# Patient Record
Sex: Female | Born: 1950 | Race: White | Hispanic: No | Smoking: Never smoker
Health system: Southern US, Community
[De-identification: ages and names within clinical notes are randomized; demographics above are authoritative.]

## PROBLEM LIST (undated history)

## (undated) DIAGNOSIS — R51 Headache: Secondary | ICD-10-CM

## (undated) DIAGNOSIS — F329 Major depressive disorder, single episode, unspecified: Secondary | ICD-10-CM

## (undated) DIAGNOSIS — R519 Headache, unspecified: Secondary | ICD-10-CM

## (undated) DIAGNOSIS — F32A Depression, unspecified: Secondary | ICD-10-CM

## (undated) HISTORY — DX: Major depressive disorder, single episode, unspecified: F32.9

## (undated) HISTORY — DX: Headache, unspecified: R51.9

## (undated) HISTORY — DX: Headache: R51

## (undated) HISTORY — DX: Depression, unspecified: F32.A

---

## 1996-08-01 HISTORY — PX: COSMETIC SURGERY: SHX468

## 1996-08-01 HISTORY — PX: TUBAL LIGATION: SHX77

## 1998-12-23 ENCOUNTER — Other Ambulatory Visit: Admission: RE | Admit: 1998-12-23 | Discharge: 1998-12-23 | Payer: Self-pay | Admitting: Obstetrics and Gynecology

## 1999-09-14 ENCOUNTER — Encounter: Payer: Self-pay | Admitting: Obstetrics and Gynecology

## 1999-09-14 ENCOUNTER — Encounter: Admission: RE | Admit: 1999-09-14 | Discharge: 1999-09-14 | Payer: Self-pay | Admitting: Obstetrics and Gynecology

## 1999-12-22 ENCOUNTER — Other Ambulatory Visit: Admission: RE | Admit: 1999-12-22 | Discharge: 1999-12-22 | Payer: Self-pay | Admitting: Obstetrics and Gynecology

## 2000-09-19 ENCOUNTER — Encounter: Payer: Self-pay | Admitting: Obstetrics and Gynecology

## 2000-09-19 ENCOUNTER — Encounter: Admission: RE | Admit: 2000-09-19 | Discharge: 2000-09-19 | Payer: Self-pay | Admitting: Obstetrics and Gynecology

## 2001-09-27 ENCOUNTER — Encounter: Payer: Self-pay | Admitting: Obstetrics and Gynecology

## 2001-09-27 ENCOUNTER — Encounter: Admission: RE | Admit: 2001-09-27 | Discharge: 2001-09-27 | Payer: Self-pay | Admitting: Obstetrics and Gynecology

## 2002-07-17 ENCOUNTER — Encounter: Admission: RE | Admit: 2002-07-17 | Discharge: 2002-07-17 | Payer: Self-pay | Admitting: Obstetrics and Gynecology

## 2002-07-17 ENCOUNTER — Encounter: Payer: Self-pay | Admitting: Obstetrics and Gynecology

## 2002-08-05 ENCOUNTER — Other Ambulatory Visit: Admission: RE | Admit: 2002-08-05 | Discharge: 2002-08-05 | Payer: Self-pay | Admitting: Obstetrics and Gynecology

## 2002-08-23 ENCOUNTER — Encounter: Payer: Self-pay | Admitting: Obstetrics and Gynecology

## 2002-08-23 ENCOUNTER — Encounter: Admission: RE | Admit: 2002-08-23 | Discharge: 2002-08-23 | Payer: Self-pay | Admitting: Obstetrics and Gynecology

## 2002-10-15 ENCOUNTER — Encounter: Payer: Self-pay | Admitting: Obstetrics and Gynecology

## 2002-10-15 ENCOUNTER — Encounter: Admission: RE | Admit: 2002-10-15 | Discharge: 2002-10-15 | Payer: Self-pay | Admitting: Obstetrics and Gynecology

## 2003-11-06 ENCOUNTER — Encounter: Admission: RE | Admit: 2003-11-06 | Discharge: 2003-11-06 | Payer: Self-pay | Admitting: Obstetrics and Gynecology

## 2004-07-21 ENCOUNTER — Encounter: Admission: RE | Admit: 2004-07-21 | Discharge: 2004-07-21 | Payer: Self-pay | Admitting: Obstetrics and Gynecology

## 2004-12-31 ENCOUNTER — Encounter: Admission: RE | Admit: 2004-12-31 | Discharge: 2004-12-31 | Payer: Self-pay | Admitting: Obstetrics and Gynecology

## 2006-01-05 ENCOUNTER — Encounter: Admission: RE | Admit: 2006-01-05 | Discharge: 2006-01-05 | Payer: Self-pay | Admitting: Obstetrics and Gynecology

## 2006-10-18 ENCOUNTER — Other Ambulatory Visit: Admission: RE | Admit: 2006-10-18 | Discharge: 2006-10-18 | Payer: Self-pay | Admitting: Obstetrics and Gynecology

## 2007-01-17 ENCOUNTER — Encounter: Admission: RE | Admit: 2007-01-17 | Discharge: 2007-01-17 | Payer: Self-pay | Admitting: Obstetrics and Gynecology

## 2008-02-06 ENCOUNTER — Other Ambulatory Visit: Admission: RE | Admit: 2008-02-06 | Discharge: 2008-02-06 | Payer: Self-pay | Admitting: Obstetrics and Gynecology

## 2008-02-15 ENCOUNTER — Encounter: Admission: RE | Admit: 2008-02-15 | Discharge: 2008-02-15 | Payer: Self-pay | Admitting: Obstetrics and Gynecology

## 2008-06-19 ENCOUNTER — Emergency Department (HOSPITAL_COMMUNITY): Admission: EM | Admit: 2008-06-19 | Discharge: 2008-06-19 | Payer: Self-pay | Admitting: Emergency Medicine

## 2009-02-25 ENCOUNTER — Encounter: Admission: RE | Admit: 2009-02-25 | Discharge: 2009-02-25 | Payer: Self-pay | Admitting: Obstetrics and Gynecology

## 2010-03-08 ENCOUNTER — Encounter: Admission: RE | Admit: 2010-03-08 | Discharge: 2010-03-08 | Payer: Self-pay | Admitting: Obstetrics and Gynecology

## 2010-04-21 ENCOUNTER — Encounter: Admission: RE | Admit: 2010-04-21 | Discharge: 2010-04-21 | Payer: Self-pay | Admitting: Internal Medicine

## 2010-09-17 ENCOUNTER — Other Ambulatory Visit: Payer: Self-pay | Admitting: Neurology

## 2010-09-17 ENCOUNTER — Ambulatory Visit
Admission: RE | Admit: 2010-09-17 | Discharge: 2010-09-17 | Disposition: A | Discharge status: Home / Self Care | Payer: BC Managed Care – PPO | Source: Ambulatory Visit | Attending: Neurology | Admitting: Neurology

## 2010-09-17 DIAGNOSIS — R51 Headache: Secondary | ICD-10-CM

## 2011-01-12 ENCOUNTER — Other Ambulatory Visit: Payer: Self-pay | Admitting: Neurology

## 2011-01-12 DIAGNOSIS — R51 Headache: Secondary | ICD-10-CM

## 2011-01-17 ENCOUNTER — Other Ambulatory Visit: Payer: Self-pay | Admitting: Radiology

## 2011-01-17 ENCOUNTER — Other Ambulatory Visit (HOSPITAL_COMMUNITY)
Admission: RE | Admit: 2011-01-17 | Discharge: 2011-01-17 | Disposition: A | Discharge status: Home / Self Care | Payer: BC Managed Care – PPO | Source: Ambulatory Visit | Attending: Radiology | Admitting: Radiology

## 2011-01-17 ENCOUNTER — Ambulatory Visit
Admission: RE | Admit: 2011-01-17 | Discharge: 2011-01-17 | Disposition: A | Discharge status: Home / Self Care | Payer: BC Managed Care – PPO | Source: Ambulatory Visit | Attending: Neurology | Admitting: Neurology

## 2011-01-17 DIAGNOSIS — R51 Headache: Secondary | ICD-10-CM

## 2011-02-04 ENCOUNTER — Other Ambulatory Visit: Payer: Self-pay | Admitting: Internal Medicine

## 2011-02-04 DIAGNOSIS — Z1231 Encounter for screening mammogram for malignant neoplasm of breast: Secondary | ICD-10-CM

## 2011-03-14 ENCOUNTER — Ambulatory Visit
Admission: RE | Admit: 2011-03-14 | Discharge: 2011-03-14 | Disposition: A | Discharge status: Home / Self Care | Payer: BC Managed Care – PPO | Source: Ambulatory Visit | Attending: Internal Medicine | Admitting: Internal Medicine

## 2011-03-14 DIAGNOSIS — Z1231 Encounter for screening mammogram for malignant neoplasm of breast: Secondary | ICD-10-CM

## 2011-10-31 ENCOUNTER — Other Ambulatory Visit: Payer: Self-pay | Admitting: Internal Medicine

## 2012-01-22 ENCOUNTER — Other Ambulatory Visit: Payer: Self-pay | Admitting: Internal Medicine

## 2012-01-30 ENCOUNTER — Other Ambulatory Visit: Payer: Self-pay | Admitting: Physician Assistant

## 2012-04-17 ENCOUNTER — Other Ambulatory Visit: Payer: Self-pay | Admitting: Internal Medicine

## 2012-04-17 DIAGNOSIS — Z1231 Encounter for screening mammogram for malignant neoplasm of breast: Secondary | ICD-10-CM

## 2012-04-18 ENCOUNTER — Encounter: Payer: Self-pay | Admitting: Internal Medicine

## 2012-04-18 ENCOUNTER — Ambulatory Visit (INDEPENDENT_AMBULATORY_CARE_PROVIDER_SITE_OTHER): Payer: BC Managed Care – PPO | Admitting: Internal Medicine

## 2012-04-18 VITALS — BP 120/82 | HR 73 | Temp 97.7°F | Resp 16 | Ht 64.0 in | Wt 170.0 lb

## 2012-04-18 DIAGNOSIS — M531 Cervicobrachial syndrome: Secondary | ICD-10-CM

## 2012-04-18 DIAGNOSIS — F32A Depression, unspecified: Secondary | ICD-10-CM

## 2012-04-18 DIAGNOSIS — Z6829 Body mass index (BMI) 29.0-29.9, adult: Secondary | ICD-10-CM

## 2012-04-18 DIAGNOSIS — R2230 Localized swelling, mass and lump, unspecified upper limb: Secondary | ICD-10-CM

## 2012-04-18 DIAGNOSIS — F329 Major depressive disorder, single episode, unspecified: Secondary | ICD-10-CM | POA: Insufficient documentation

## 2012-04-18 DIAGNOSIS — G4700 Insomnia, unspecified: Secondary | ICD-10-CM

## 2012-04-18 DIAGNOSIS — E785 Hyperlipidemia, unspecified: Secondary | ICD-10-CM

## 2012-04-18 DIAGNOSIS — E039 Hypothyroidism, unspecified: Secondary | ICD-10-CM

## 2012-04-18 DIAGNOSIS — H8109 Meniere's disease, unspecified ear: Secondary | ICD-10-CM

## 2012-04-18 DIAGNOSIS — R229 Localized swelling, mass and lump, unspecified: Secondary | ICD-10-CM

## 2012-04-18 DIAGNOSIS — M5481 Occipital neuralgia: Secondary | ICD-10-CM

## 2012-04-18 MED ORDER — AMOXICILLIN-POT CLAVULANATE 875-125 MG PO TABS: 1.0000 | ORAL_TABLET | Freq: Two times a day (BID) | ORAL | Status: DC

## 2012-04-18 MED ORDER — TEMAZEPAM 30 MG PO CAPS: 30.0000 mg | ORAL_CAPSULE | Freq: Every evening | ORAL | Status: DC | PRN

## 2012-04-18 NOTE — Progress Notes (Addendum)
  Subjective:    Patient ID: Danielle Jacobs, female    DOB: [birth year], 61 y.o.   MRN: [id number]  HPIComplaining of a firm lump in the right triceps area for the past 2 months/occasionally tender  Continues to complain of severe lancinating pain in the right occiput/has been evaluated by Dr. Sandria Manly and by Dr. Neale Burly and no therapies have been successful over the last 3 years. This leaves her greatly distressed every day. She still cannot identify precipitating factors.  Many years has been stable since Dr. Dorma Russell put a tube in her left ear  Hypothyroidism stable Hyperlipidemia no medication side effects Depression-still greatly affected by her daughter's trials with alcohol   Review of Systems  additional symptoms=Recent outdoor venture led to diarrhea with fever followed by resp. illness. She does continue to cough for the last 3 weeks although there is no fever or night sweats and cough is not productive  Also continues to complain of intermittent insomnia/frequently using over-the-counter sleep aids Long history of this/has responded her Restoril in the past    Objective:   Physical Exam Vital signs stable except overweight HEENT clear Lungs clear  heart regular without murmur Right arm-1.5 cm firm mass- slightly tender, freely movable mass on the distal part of the triceps area  Possibly part of the triceps tendon/not bony in origin/no changes of dermatofibroma/no skin changes     Assessment & Plan:   1. BMI 29.0-29.9,adult   2. Hyperlipidemia --Labs in October or November  3. Hypothyroidism --Ditto  4. Depression   5. Occipital neuralgia --Augmentin trial plus referral to [facility]  6. Meniere's disease   7.  Subcutaneous mass right arm-referred to surgery 8.  Insomnia--Restoril    Adden9/23= disc w/ Dr Derrell Lolling who rec MRI first=I ordered Call to tell Danielle Jacobs we are scheduling this(soft tiss MRI)

## 2012-04-19 ENCOUNTER — Other Ambulatory Visit: Payer: Self-pay

## 2012-04-19 MED ORDER — SERTRALINE HCL 100 MG PO TABS: 100.0000 mg | ORAL_TABLET | Freq: Every day | ORAL | Status: DC

## 2012-04-23 NOTE — Addendum Note (Signed)
Addended by: Tonye Pearson on: 04/23/2012 03:57 PM   Modules accepted: Orders

## 2012-05-02 ENCOUNTER — Other Ambulatory Visit: Payer: Self-pay | Admitting: Radiology

## 2012-05-02 MED ORDER — ATORVASTATIN CALCIUM 20 MG PO TABS: 20.0000 mg | ORAL_TABLET | Freq: Every day | ORAL | Status: DC

## 2012-05-05 ENCOUNTER — Other Ambulatory Visit: Payer: Self-pay

## 2012-05-05 MED ORDER — LEVOTHYROXINE SODIUM 75 MCG PO TABS: 75.0000 ug | ORAL_TABLET | Freq: Every day | ORAL | Status: DC

## 2012-05-07 ENCOUNTER — Other Ambulatory Visit: Payer: Self-pay | Admitting: Radiology

## 2012-05-10 ENCOUNTER — Ambulatory Visit: Payer: BC Managed Care – PPO

## 2012-05-21 ENCOUNTER — Ambulatory Visit
Admission: RE | Admit: 2012-05-21 | Discharge: 2012-05-21 | Disposition: A | Discharge status: Home / Self Care | Payer: BC Managed Care – PPO | Source: Ambulatory Visit | Attending: Internal Medicine | Admitting: Internal Medicine

## 2012-05-21 DIAGNOSIS — Z1231 Encounter for screening mammogram for malignant neoplasm of breast: Secondary | ICD-10-CM

## 2012-07-18 ENCOUNTER — Other Ambulatory Visit: Payer: Self-pay | Admitting: Physician Assistant

## 2012-07-22 ENCOUNTER — Other Ambulatory Visit: Payer: Self-pay

## 2012-07-22 NOTE — Telephone Encounter (Signed)
Pt is needing to talk with someone about refilling her temazepam, states that they pharmacy has tried to request this refill and her husband has been by pharmacy twice today and it has still not been refilled  Best number [phone]

## 2012-07-23 ENCOUNTER — Other Ambulatory Visit: Payer: Self-pay | Admitting: Radiology

## 2012-07-23 ENCOUNTER — Telehealth: Payer: Self-pay | Admitting: Radiology

## 2012-07-23 ENCOUNTER — Telehealth: Payer: Self-pay

## 2012-07-23 MED ORDER — TEMAZEPAM 30 MG PO CAPS: 30.0000 mg | ORAL_CAPSULE | Freq: Every evening | ORAL | Status: DC | PRN

## 2012-07-23 NOTE — Telephone Encounter (Signed)
Patient was here last for insomnia in Sept to see Dr Merla Riches he is out of the office, can you advise on renewal of the generic restoril?, or we can forward to him, pended Rx

## 2012-07-23 NOTE — Telephone Encounter (Signed)
Called to [facility]. Called patient to advise

## 2012-07-23 NOTE — Telephone Encounter (Signed)
Pt is calling back to see if we have called [facility]   Best number [phone]

## 2012-07-23 NOTE — Telephone Encounter (Signed)
Patient called about her temazepam. I advised her I will call her back this pm to advise. She is advised request sent to [place] since Dr Merla Riches out of the office, she is upset it has not been filled yet.

## 2012-07-26 NOTE — Telephone Encounter (Signed)
Yes, I had left her a message to advise, she must not have gotten my message. I called her to advise.

## 2012-07-31 ENCOUNTER — Other Ambulatory Visit: Payer: Self-pay | Admitting: Physician Assistant

## 2012-07-31 NOTE — Telephone Encounter (Signed)
Needs OV /labs

## 2012-08-01 ENCOUNTER — Other Ambulatory Visit: Payer: Self-pay | Admitting: Radiology

## 2012-08-20 ENCOUNTER — Telehealth: Payer: Self-pay | Admitting: *Deleted

## 2012-08-20 ENCOUNTER — Other Ambulatory Visit: Payer: Self-pay | Admitting: *Deleted

## 2012-08-20 ENCOUNTER — Other Ambulatory Visit: Payer: Self-pay | Admitting: Physician Assistant

## 2012-08-20 DIAGNOSIS — G4700 Insomnia, unspecified: Secondary | ICD-10-CM

## 2012-08-20 MED ORDER — ATORVASTATIN CALCIUM 20 MG PO TABS: 20.0000 mg | ORAL_TABLET | Freq: Every day | ORAL | Status: DC

## 2012-08-20 MED ORDER — TEMAZEPAM 30 MG PO CAPS: 30.0000 mg | ORAL_CAPSULE | Freq: Every evening | ORAL | Status: DC | PRN

## 2012-08-20 NOTE — Telephone Encounter (Signed)
She has appt soon Can do 1 month Meds ordered this encounter  Medications  . temazepam (RESTORIL) 30 MG capsule    Sig: Take 1 capsule (30 mg total) by mouth at bedtime as needed for sleep. Need office visit with Dr. Merla Riches for additional refills.    Dispense:  30 capsule    Refill:  0

## 2012-08-20 NOTE — Telephone Encounter (Signed)
[facility] requesting a refill on temazepam 30mg .  We advised her that she needed an office visit when we called in last fill.  Can we authorized 15 day supply?

## 2012-08-20 NOTE — Telephone Encounter (Signed)
rx called in

## 2012-08-29 ENCOUNTER — Encounter: Payer: BC Managed Care – PPO | Admitting: Internal Medicine

## 2012-09-12 ENCOUNTER — Encounter: Payer: Self-pay | Admitting: Internal Medicine

## 2012-09-12 ENCOUNTER — Ambulatory Visit (INDEPENDENT_AMBULATORY_CARE_PROVIDER_SITE_OTHER): Payer: BC Managed Care – PPO | Admitting: Internal Medicine

## 2012-09-12 VITALS — BP 108/62 | HR 69 | Temp 98.8°F | Resp 16 | Ht 64.0 in | Wt 170.0 lb

## 2012-09-12 DIAGNOSIS — F329 Major depressive disorder, single episode, unspecified: Secondary | ICD-10-CM

## 2012-09-12 DIAGNOSIS — G4700 Insomnia, unspecified: Secondary | ICD-10-CM

## 2012-09-12 DIAGNOSIS — Z6829 Body mass index (BMI) 29.0-29.9, adult: Secondary | ICD-10-CM

## 2012-09-12 DIAGNOSIS — E785 Hyperlipidemia, unspecified: Secondary | ICD-10-CM

## 2012-09-12 DIAGNOSIS — Z23 Encounter for immunization: Secondary | ICD-10-CM

## 2012-09-12 DIAGNOSIS — E039 Hypothyroidism, unspecified: Secondary | ICD-10-CM

## 2012-09-12 DIAGNOSIS — H8109 Meniere's disease, unspecified ear: Secondary | ICD-10-CM

## 2012-09-12 DIAGNOSIS — M5481 Occipital neuralgia: Secondary | ICD-10-CM

## 2012-09-12 DIAGNOSIS — Z0000 Encounter for general adult medical examination without abnormal findings: Secondary | ICD-10-CM

## 2012-09-12 LAB — POCT UA - MICROSCOPIC ONLY
Mucus, UA: NEGATIVE
WBC, Ur, HPF, POC: NEGATIVE

## 2012-09-12 LAB — LIPID PANEL
HDL: 60 mg/dL (ref >39)
Total CHOL/HDL Ratio: 3 Ratio
Triglycerides: 76 mg/dL (ref <150)

## 2012-09-12 LAB — POCT URINALYSIS DIPSTICK
Bilirubin, UA: NEGATIVE
Blood, UA: NEGATIVE
Ketones, UA: NEGATIVE
Leukocytes, UA: NEGATIVE
pH, UA: 6.5

## 2012-09-12 MED ORDER — LEVOTHYROXINE SODIUM 75 MCG PO TABS: 75.0000 ug | ORAL_TABLET | Freq: Every day | ORAL | Status: DC

## 2012-09-12 MED ORDER — TRIAMTERENE-HCTZ 37.5-25 MG PO TABS: 1.0000 | ORAL_TABLET | Freq: Every day | ORAL | Status: DC

## 2012-09-12 MED ORDER — TEMAZEPAM 30 MG PO CAPS: 30.0000 mg | ORAL_CAPSULE | Freq: Every evening | ORAL | Status: DC | PRN

## 2012-09-12 MED ORDER — SERTRALINE HCL 100 MG PO TABS: 100.0000 mg | ORAL_TABLET | Freq: Every day | ORAL | Status: DC

## 2012-09-12 MED ORDER — ATORVASTATIN CALCIUM 20 MG PO TABS: 20.0000 mg | ORAL_TABLET | Freq: Every day | ORAL | Status: DC

## 2012-09-12 NOTE — Progress Notes (Signed)
Subjective:    Patient ID: Danielle Jacobs, female    DOB: May 30, 1951, 62 y.o.   MRN: [id number]  HPI here for annual physical Patient Active Problem List  Diagnosis  . BMI 29.0-29.9,adult  . Hyperlipidemia  . Hypothyroidism  . Depression-main issues continue to be related to her alcoholic 60 year old daughter who is draining the family's resources without ever improving with any kind of therapy -including multiple attempts at detox programs //she is back living with mom temporarily   . Occipital neuralgia  . Meniere's disease   she has retired!!!! She continues with her evaluation by Dr. Lurena Joiner whales at [facility] with regard to her occipital neuralgia and there are multiple lab tests that have been done recently although no conclusions or successful treatment has been reached Her Mnire's disease now treated Dr. Dorma Russell with Maxzide/left ear/has myringotomy tube-slightly better     Review of Systems  Constitutional: Negative for activity change and appetite change.       With her retirement she has been traveling for extended trips to the mountains and to sisters in [place]  HENT: Positive for tinnitus. Negative for hearing loss, nosebleeds, congestion, rhinorrhea, trouble swallowing, neck pain, dental problem and ear discharge.   Eyes: Negative for photophobia and visual disturbance.  Respiratory: Negative for cough, chest tightness and shortness of breath.   Cardiovascular: Negative for chest pain, palpitations and leg swelling.  Gastrointestinal: Negative for abdominal pain, diarrhea, constipation and abdominal distention.  Endocrine: Negative for cold intolerance, heat intolerance, polydipsia, polyphagia and polyuria.       Thyroid studies from [facility] reportedly normal  Genitourinary:       Has gynecology who also orders mammograms especially in view of family history of breast cancer although she has outlived the age of onset  Allergic/Immunologic: Negative for  immunocompromised state.  Neurological: Negative for dizziness, weakness and headaches.  Hematological: Negative for adenopathy. Does not bruise/bleed easily.  Psychiatric/Behavioral: Negative for behavioral problems, sleep disturbance, self-injury, decreased concentration and agitation.       Objective:   Physical Exam Vital signs stable HEENT-right ear clear The myringotomy tube is positioned in the middle of the external auditory canal and there is a scar on the tympanic membrane on the left Nares clear Throat clear No nodes or thyromegaly Heart regular without murmur Lungs clear to auscultation Abdomen benign with no organomegaly or masses Extremities with no edema and with full peripheral pulses Back straight/straight leg raise negative Neck full range of motion No restriction of motion but large joints Cranial nerves II through XII intact Deep tendon reflexes symmetrical Gait normal Mood good despite adversity/judgment sound       Assessment & Plan:  Annual physical examination 1. Routine general medical examination at a health care facility  POCT UA - Microscopic Only  2. BMI 29.0-29.9,adult    3. Depression  sertraline (ZOLOFT) 100 MG tablet  4. Hyperlipidemia  Lipid panel   atorvastatin (LIPITOR) 20 MG tablet  5. Hypothyroidism  levothyroxine (SYNTHROID) 75 MCG tablet  6. Meniere's disease -Dr. Dorma Russell  triamterene-hydrochlorothiazide (MAXZIDE-25) 37.5-25 MG per tablet  7. Occipital neuralgia -Dr wells [facility]   8. Insomnia  temazepam (RESTORIL) 30 MG capsule  9. Need for prophylactic vaccination against Streptococcus pneumoniae (pneumococcus)  Pneumococcal polysaccharide vaccine 23-valent greater than or equal to 2yo subcutaneous/IM   Meds ordered this encounter  Medications  . DISCONTD: indomethacin (INDOCIN) 25 MG capsule    Sig: Take 25 mg by mouth 3 (three) times daily with  meals.  . aspirin EC 81 MG tablet    Sig: Take 81 mg by mouth daily.  . Multiple  Vitamin (MULTIVITAMIN) tablet    Sig: Take 1 tablet by mouth daily.  Marland Kitchen atorvastatin (LIPITOR) 20 MG tablet    Sig: Take 1 tablet (20 mg total) by mouth daily.    Dispense:  90 tablet    Refill:  3  . temazepam (RESTORIL) 30 MG capsule    Sig: Take 1 capsule (30 mg total) by mouth at bedtime as needed for sleep.    Dispense:  90 capsule    Refill:  3  . sertraline (ZOLOFT) 100 MG tablet    Sig: Take 1 tablet (100 mg total) by mouth daily.    Dispense:  90 tablet    Refill:  3  . levothyroxine (SYNTHROID) 75 MCG tablet    Sig: Take 1 tablet (75 mcg total) by mouth daily.    Dispense:  90 tablet    Refill:  3  . triamterene-hydrochlorothiazide (MAXZIDE-25) 37.5-25 MG per tablet    Sig: Take 1 each (1 tablet total) by mouth daily.    Dispense:  90 tablet    Refill:  3   will check labs at [facility] Check lipid profile here Consider treatment under the guidelines/because of the death of her mother and father with cardiovascular/stroke events although in the 80s and 90s, she would choose to stay on Lipitor because of no side effect

## 2012-09-12 NOTE — Progress Notes (Signed)
  Subjective:    Patient ID: Danielle Jacobs, female    DOB: 1950/11/28, 62 y.o.   MRN: [id number]  HPI    Review of Systems  Constitutional: Negative.   HENT:       Has head pain  Eyes: Positive for discharge.  Respiratory: Negative.   Cardiovascular: Negative.   Gastrointestinal: Negative.   Endocrine: Negative.   Genitourinary: Negative.   Musculoskeletal: Negative.   Skin: Positive for rash.       eczema  Allergic/Immunologic: Negative.   Neurological: Positive for headaches.       Pain in one area  Hematological: Negative.   Psychiatric/Behavioral: Negative.        Objective:   Physical Exam        Assessment & Plan:

## 2012-09-17 ENCOUNTER — Encounter: Payer: Self-pay | Admitting: Internal Medicine

## 2013-02-23 ENCOUNTER — Ambulatory Visit (INDEPENDENT_AMBULATORY_CARE_PROVIDER_SITE_OTHER): Payer: BC Managed Care – PPO | Admitting: Internal Medicine

## 2013-02-23 VITALS — BP 124/76 | HR 91 | Temp 98.0°F | Resp 17 | Ht 64.5 in | Wt 166.0 lb

## 2013-02-23 DIAGNOSIS — R300 Dysuria: Secondary | ICD-10-CM

## 2013-02-23 DIAGNOSIS — N390 Urinary tract infection, site not specified: Secondary | ICD-10-CM

## 2013-02-23 LAB — POCT URINALYSIS DIPSTICK
Bilirubin, UA: NEGATIVE
Glucose, UA: NEGATIVE
Ketones, UA: NEGATIVE
Nitrite, UA: NEGATIVE
Protein, UA: 30
Spec Grav, UA: 1.015
Urobilinogen, UA: 0.2
pH, UA: 7

## 2013-02-23 LAB — POCT UA - MICROSCOPIC ONLY
Casts, Ur, LPF, POC: NEGATIVE
Crystals, Ur, HPF, POC: NEGATIVE
Mucus, UA: NEGATIVE
Yeast, UA: NEGATIVE

## 2013-02-23 MED ORDER — CIPROFLOXACIN HCL 250 MG PO TABS: 250.0000 mg | ORAL_TABLET | Freq: Two times a day (BID) | ORAL | Status: DC

## 2013-02-23 NOTE — Progress Notes (Signed)
  Subjective:    Patient ID: Danielle Jacobs, female    DOB: 10-03-1950, 62 y.o.   MRN: [id number]  HPI dysuria frequency and urgency for 7-10 days Occasional nausea No belly pain/no back pain/no fever   Alcoholism daughter Review of Systems     Objective:   Physical Exam BP 124/76  Pulse 91  Temp(Src) 98 F (36.7 C) (Oral)  Resp 17  Ht 5' 4.5" (1.638 m)  Wt 166 lb (75.297 kg)  BMI 28.06 kg/m2  SpO2 95% Abdomen benign No CVA tenderness       Results for orders placed in visit on 02/23/13  POCT URINALYSIS DIPSTICK      Result Value Range   Color, UA yellowish pink     Clarity, UA hazy     Glucose, UA neg     Bilirubin, UA neg     Ketones, UA neg     Spec Grav, UA 1.015     Blood, UA large     pH, UA 7.0     Protein, UA 30     Urobilinogen, UA 0.2     Nitrite, UA neg     Leukocytes, UA Trace    POCT UA - MICROSCOPIC ONLY      Result Value Range   WBC, Ur, HPF, POC 7-10     RBC, urine, microscopic tntc     Bacteria, U Microscopic trace     Mucus, UA neg     Epithelial cells, urine per micros 0-2     Crystals, Ur, HPF, POC neg     Casts, Ur, LPF, POC neg     Yeast, UA neg       Assessment & Plan:  Dysuria= urinary tract infection  Meds ordered this encounter  Medications  . ciprofloxacin (CIPRO) 250 MG tablet    Sig: Take 1 tablet (250 mg total) by mouth 2 (two) times daily.    Dispense:  20 tablet    Refill:  0   culture

## 2013-02-24 LAB — URINE CULTURE: Colony Count: 25000

## 2013-02-26 ENCOUNTER — Encounter: Payer: Self-pay | Admitting: Internal Medicine

## 2013-04-17 ENCOUNTER — Telehealth: Payer: Self-pay

## 2013-04-17 MED ORDER — TEMAZEPAM 30 MG PO CAPS: 30.0000 mg | ORAL_CAPSULE | Freq: Every evening | ORAL | Status: DC | PRN

## 2013-04-17 NOTE — Telephone Encounter (Signed)
Pharm sent RF req for temazepam. Called to see if they have record of Dr Doolittle's Rx he sent 09/12/12 for #90 w/3RFs?. I was advised that they did have that and pt only got it filled once, but has expired and need a new one. I called in for #90 w/1RF to complete the year Dr Merla Riches had originally ordered.

## 2013-05-09 ENCOUNTER — Other Ambulatory Visit: Payer: Self-pay

## 2013-05-09 DIAGNOSIS — Z1231 Encounter for screening mammogram for malignant neoplasm of breast: Secondary | ICD-10-CM

## 2013-05-27 ENCOUNTER — Ambulatory Visit: Payer: BC Managed Care – PPO

## 2013-06-06 ENCOUNTER — Other Ambulatory Visit: Payer: Self-pay

## 2013-06-18 ENCOUNTER — Ambulatory Visit
Admission: RE | Admit: 2013-06-18 | Discharge: 2013-06-18 | Disposition: A | Discharge status: Home / Self Care | Payer: BC Managed Care – PPO | Source: Ambulatory Visit

## 2013-06-18 DIAGNOSIS — Z1231 Encounter for screening mammogram for malignant neoplasm of breast: Secondary | ICD-10-CM

## 2013-07-03 ENCOUNTER — Telehealth: Payer: Self-pay | Admitting: *Deleted

## 2013-07-03 ENCOUNTER — Telehealth: Payer: Self-pay

## 2013-07-03 ENCOUNTER — Other Ambulatory Visit: Payer: Self-pay | Admitting: Internal Medicine

## 2013-07-03 MED ORDER — NITROFURANTOIN MONOHYD MACRO 100 MG PO CAPS: 100.0000 mg | ORAL_CAPSULE | Freq: Two times a day (BID) | ORAL | Status: DC

## 2013-07-03 NOTE — Telephone Encounter (Signed)
Patient has a uti and is out of town, would like to know if dr Merla Riches would please call her in something [facility] [place] ([address] phone number is [phone]

## 2013-07-03 NOTE — Progress Notes (Signed)
uti-in [place] working Meds ordered this encounter  Medications  . nitrofurantoin, macrocrystal-monohydrate, (MACROBID) 100 MG capsule    Sig: Take 1 capsule (100 mg total) by mouth 2 (two) times daily.    Dispense:  14 capsule    Refill:  0  call [phone] [facility]

## 2013-07-03 NOTE — Telephone Encounter (Signed)
Called [facility]ro[facility] [place] [phone]), per Dr Merla Riches.

## 2013-07-03 NOTE — Telephone Encounter (Signed)
Unable to prescribe antibiotics without office visit. She would need U/A and culture. Called her to advise.

## 2013-09-10 ENCOUNTER — Other Ambulatory Visit: Payer: Self-pay | Admitting: Internal Medicine

## 2013-09-18 ENCOUNTER — Ambulatory Visit (INDEPENDENT_AMBULATORY_CARE_PROVIDER_SITE_OTHER): Payer: BC Managed Care – PPO | Admitting: Internal Medicine

## 2013-09-18 ENCOUNTER — Encounter: Payer: Self-pay | Admitting: Internal Medicine

## 2013-09-18 VITALS — BP 124/72 | HR 67 | Temp 97.9°F | Resp 16 | Ht 64.5 in | Wt 171.0 lb

## 2013-09-18 DIAGNOSIS — Z23 Encounter for immunization: Secondary | ICD-10-CM

## 2013-09-18 DIAGNOSIS — H8109 Meniere's disease, unspecified ear: Secondary | ICD-10-CM

## 2013-09-18 DIAGNOSIS — F329 Major depressive disorder, single episode, unspecified: Secondary | ICD-10-CM

## 2013-09-18 DIAGNOSIS — E039 Hypothyroidism, unspecified: Secondary | ICD-10-CM

## 2013-09-18 DIAGNOSIS — Z6829 Body mass index (BMI) 29.0-29.9, adult: Secondary | ICD-10-CM

## 2013-09-18 DIAGNOSIS — F32A Depression, unspecified: Secondary | ICD-10-CM

## 2013-09-18 DIAGNOSIS — Z0000 Encounter for general adult medical examination without abnormal findings: Secondary | ICD-10-CM

## 2013-09-18 DIAGNOSIS — E785 Hyperlipidemia, unspecified: Secondary | ICD-10-CM

## 2013-09-18 LAB — LIPID PANEL
CHOL/HDL RATIO: 3.1 ratio
Cholesterol: 165 mg/dL (ref 0–200)
HDL: 54 mg/dL (ref >39)
LDL CALC: 84 mg/dL (ref 0–99)
Triglycerides: 137 mg/dL (ref <150)
VLDL: 27 mg/dL (ref 0–40)

## 2013-09-18 LAB — COMPLETE METABOLIC PANEL WITH GFR
ALBUMIN: 4.7 g/dL (ref 3.5–5.2)
ALK PHOS: 73 U/L (ref 39–117)
ALT: 36 U/L — ABNORMAL HIGH (ref 0–35)
AST: 36 U/L (ref 0–37)
BUN: 14 mg/dL (ref 6–23)
CO2: 29 mEq/L (ref 19–32)
Calcium: 10 mg/dL (ref 8.4–10.5)
Chloride: 97 mEq/L (ref 96–112)
Creat: 0.85 mg/dL (ref 0.50–1.10)
GFR, Est African American: 85 mL/min
GFR, Est Non African American: 74 mL/min
Glucose, Bld: 91 mg/dL (ref 70–99)
POTASSIUM: 4.1 meq/L (ref 3.5–5.3)
SODIUM: 137 meq/L (ref 135–145)
TOTAL PROTEIN: 7.1 g/dL (ref 6.0–8.3)
Total Bilirubin: 1.2 mg/dL (ref 0.2–1.2)

## 2013-09-18 LAB — CBC WITH DIFFERENTIAL/PLATELET
BASOS ABS: 0 10*3/uL (ref 0.0–0.1)
BASOS PCT: 1 % (ref 0–1)
EOS ABS: 0.1 10*3/uL (ref 0.0–0.7)
Eosinophils Relative: 3 % (ref 0–5)
HCT: 42.5 % (ref 36.0–46.0)
Hemoglobin: 14.5 g/dL (ref 12.0–15.0)
Lymphocytes Relative: 24 % (ref 12–46)
Lymphs Abs: 0.9 10*3/uL (ref 0.7–4.0)
MCH: 31.2 pg (ref 26.0–34.0)
MCHC: 34.1 g/dL (ref 30.0–36.0)
MCV: 91.4 fL (ref 78.0–100.0)
Monocytes Absolute: 0.3 10*3/uL (ref 0.1–1.0)
Monocytes Relative: 9 % (ref 3–12)
NEUTROS ABS: 2.3 10*3/uL (ref 1.7–7.7)
NEUTROS PCT: 63 % (ref 43–77)
PLATELETS: 275 10*3/uL (ref 150–400)
RBC: 4.65 MIL/uL (ref 3.87–5.11)
RDW: 13.1 % (ref 11.5–15.5)
WBC: 3.6 10*3/uL — ABNORMAL LOW (ref 4.0–10.5)

## 2013-09-18 LAB — POCT URINALYSIS DIPSTICK
Bilirubin, UA: NEGATIVE
Blood, UA: NEGATIVE
Glucose, UA: NEGATIVE
Ketones, UA: NEGATIVE
Leukocytes, UA: NEGATIVE
Nitrite, UA: NEGATIVE
Protein, UA: NEGATIVE
Spec Grav, UA: 1.02
Urobilinogen, UA: 0.2
pH, UA: 7

## 2013-09-18 NOTE — Progress Notes (Addendum)
Subjective:    Patient ID: Danielle Jacobs, female    DOB: September 20, 1950, 63 y.o.   MRN: [id number] This chart was scribed for Leandrew Koyanagi, MD by Rolanda Lundborg, ED Scribe. This patient was seen in room 25 and the patient's care was started at 11:27 AM.  Chief Complaint  Patient presents with  . Annual Exam  . Medication Refill    all    HPI HPI Comments: Danielle Jacobs is a 63 y.o. female who presents to the [facility] for an annual exam and medication refill. She states although she has not gained any weight she is unhappy with her current weight. She reports lots of hiking but states it has not helped her lose weight. She states her weakness is late-night snacking. She is still having the chronic headaches. She reports a nerve block gave her relief for only one day. MRIs were negative. It gets better with activity. She also reports bilateral shoulder pain when she reaches, worse on the right. She has pain unhooking her bra but not combing her hair or brushing her teeth. She has not been sleeping well due to her daughter Danielle Jacobs's alcoholism so has been using the restoril more than usual. Pt states she took away her house key away from [place]. She denies daytime somnolence.   Patient Active Problem List   Diagnosis Date Noted  . BMI 29.0-29.9,adult--- not able to accomplish weight loss despite increased activity with good diet  04/18/2012  . Hyperlipidemia 04/18/2012  . Hypothyroidism 04/18/2012  . Depression---stable--daughter still trapped in alcoholism  04/18/2012  . Occipital neuralgia---completed w/u at [facility] to no avail so has discovered ways to deal///better if active--more active since retired 04/18/2012  . Meniere's disease---Dr Thornell Mule has stabilized with chlorthal 04/18/2012   Current Outpatient Prescriptions on File Prior to Visit  Medication Sig Dispense Refill  . aspirin EC 81 MG tablet Take 81 mg by mouth daily.      Marland Kitchen atorvastatin (LIPITOR) 20 MG  tablet Take 1 tablet (20 mg total) by mouth daily.  90 tablet  3  . levothyroxine (SYNTHROID) 75 MCG tablet Take 1 tablet (75 mcg total) by mouth daily. PATIENT NEEDS OFFICE VISIT/LABS FOR ADDITIONAL REFILLS  30 tablet  0  . Multiple Vitamin (MULTIVITAMIN) tablet Take 1 tablet by mouth daily.      . sertraline (ZOLOFT) 100 MG tablet Take 1 tablet (100 mg total) by mouth daily.  90 tablet  3  . temazepam (RESTORIL) 30 MG capsule Take 1 capsule (30 mg total) by mouth at bedtime as needed for sleep.  90 capsule  1  . triamterene-hydrochlorothiazide (MAXZIDE-25) 37.5-25 MG per tablet Take 1 each (1 tablet total) by mouth daily.  90 tablet  3   No current facility-administered medications on file prior to visit.      Review of Systems  Constitutional: Negative for fever, appetite change, fatigue and unexpected weight change.  HENT: Negative.   Eyes: Negative.   Respiratory: Negative for cough, chest tightness, shortness of breath and wheezing.   Cardiovascular: Negative for chest pain, palpitations and leg swelling.  Gastrointestinal: Negative for abdominal pain, diarrhea and constipation.  Endocrine: Negative for cold intolerance and heat intolerance.  Genitourinary: Negative for difficulty urinating, pelvic pain and dyspareunia.  Musculoskeletal:       That having trouble with shoulders/stiffness with sharp pain upon certain movements. Often trouble unhooking bra with cross chest movements  Neurological: Negative for weakness and numbness.  Psychiatric/Behavioral:  Negative for sleep disturbance.       Objective:   Physical Exam  Nursing note and vitals reviewed. Constitutional: She is oriented to person, place, and time. She appears well-developed and well-nourished. No distress.  HENT:  Head: Normocephalic and atraumatic.  Right Ear: External ear normal.  Left Ear: External ear normal.  Nose: Nose normal.  Mouth/Throat: Oropharynx is clear and moist.  Eyes: Conjunctivae and EOM  are normal. Pupils are equal, round, and reactive to light.  Neck: Normal range of motion. Neck supple. No thyromegaly present.  Cardiovascular: Normal rate, regular rhythm, normal heart sounds and intact distal pulses.   No murmur heard. Pulmonary/Chest: Effort normal and breath sounds normal. No respiratory distress. She has no wheezes.  Abdominal: She exhibits no mass. There is no tenderness.  Genitourinary: Vagina normal and uterus normal. Right adnexum displays no mass. Left adnexum displays no mass.  Musculoskeletal: Normal range of motion. She exhibits no edema.  Both shoulders have adequate range of motion//there is some discomfort with external rotation and with adduction bilaterally  Lymphadenopathy:    She has no cervical adenopathy.  Neurological: She is alert and oriented to person, place, and time. No cranial nerve deficit. Coordination normal.  Skin: Skin is warm and dry. No rash noted.  Psychiatric: She has a normal mood and affect. Her behavior is normal.    Filed Vitals:   09/18/13 1112  BP: 124/72  Pulse: 67  Temp: 97.9 F (36.6 C)  Resp: 16  Height: 5' 4.5" (1.638 m)  Weight: 171 lb (77.565 kg)  SpO2: 98%         Assessment & Plan:  Annual physical exam - Plan: POCT urinalysis dipstick, Tdap vaccine greater than or equal to 7yo IM, Flu Vaccine QUAD 36+ mos IM, CBC with Differential, COMPLETE METABOLIC PANEL WITH GFR, TSH, Lipid panel, Pap IG and HPV (high risk) DNA detection, CANCELED: Pap IG and HPV (high risk) DNA detection  Need for Tdap vaccination - Plan: Tdap vaccine greater than or equal to 7yo IM  Need for prophylactic vaccination and inoculation against influenza - Plan: Flu Vaccine QUAD 36+ mos IM  BMI 29.0-29.9,adult  Depression-continue Zoloft  Hyperlipidemia-recheck labs and continue treatment  Hypothyroidism-recheck labs and continue treatment  Meniere's disease-refill chlorthalidone  Shoulder pain---ref to PT w/ Earley Favor    I  have completed the patient encounter in its entirety as documented by the scribe, with editing by me where necessary. Riki Gehring P. Laney Pastor, M.D.  Adden=labs good Results for orders placed in visit on 09/18/13  CBC WITH DIFFERENTIAL      Result Value Ref Range   WBC 3.6 (*) 4.0 - 10.5 K/uL   RBC 4.65  3.87 - 5.11 MIL/uL   Hemoglobin 14.5  12.0 - 15.0 g/dL   HCT 42.5  36.0 - 46.0 %   MCV 91.4  78.0 - 100.0 fL   MCH 31.2  26.0 - 34.0 pg   MCHC 34.1  30.0 - 36.0 g/dL   RDW 13.1  11.5 - 15.5 %   Platelets 275  150 - 400 K/uL   Neutrophils Relative % 63  43 - 77 %   Neutro Abs 2.3  1.7 - 7.7 K/uL   Lymphocytes Relative 24  12 - 46 %   Lymphs Abs 0.9  0.7 - 4.0 K/uL   Monocytes Relative 9  3 - 12 %   Monocytes Absolute 0.3  0.1 - 1.0 K/uL   Eosinophils Relative 3  0 - 5 %  Eosinophils Absolute 0.1  0.0 - 0.7 K/uL   Basophils Relative 1  0 - 1 %   Basophils Absolute 0.0  0.0 - 0.1 K/uL   Smear Review Criteria for review not met    COMPLETE METABOLIC PANEL WITH GFR      Result Value Ref Range   Sodium 137  135 - 145 mEq/L   Potassium 4.1  3.5 - 5.3 mEq/L   Chloride 97  96 - 112 mEq/L   CO2 29  19 - 32 mEq/L   Glucose, Bld 91  70 - 99 mg/dL   BUN 14  6 - 23 mg/dL   Creat 0.85  0.50 - 1.10 mg/dL   Total Bilirubin 1.2  0.2 - 1.2 mg/dL   Alkaline Phosphatase 73  39 - 117 U/L   AST 36  0 - 37 U/L   ALT 36 (*) 0 - 35 U/L   Total Protein 7.1  6.0 - 8.3 g/dL   Albumin 4.7  3.5 - 5.2 g/dL   Calcium 10.0  8.4 - 10.5 mg/dL   GFR, Est African American 85     GFR, Est Non African American 74    TSH      Result Value Ref Range   TSH 2.036  0.350 - 4.500 uIU/mL  LIPID PANEL      Result Value Ref Range   Cholesterol 165  0 - 200 mg/dL   Triglycerides 137  <150 mg/dL   HDL 54  >39 mg/dL   Total CHOL/HDL Ratio 3.1     VLDL 27  0 - 40 mg/dL   LDL Cholesterol 84  0 - 99 mg/dL  POCT URINALYSIS DIPSTICK      Result Value Ref Range   Color, UA yellow     Clarity, UA clear     Glucose, UA  neg     Bilirubin, UA neg     Ketones, UA neg     Spec Grav, UA 1.020     Blood, UA neg     pH, UA 7.0     Protein, UA neg     Urobilinogen, UA 0.2     Nitrite, UA neg     Leukocytes, UA Negative    PAP IG AND HPV HIGH-RISK      Result Value Ref Range   HPV DNA High Risk---pending       Specimen adequacy:       FINAL DIAGNOSIS:       COMMENTS:       Cytotechnologist:        Meds ordered this encounter  Medications         . atorvastatin (LIPITOR) 20 MG tablet    Sig: Take 1 tablet (20 mg total) by mouth daily.    Dispense:  90 tablet    Refill:  3  . temazepam (RESTORIL) 30 MG capsule    Sig: Take 1 capsule (30 mg total) by mouth at bedtime as needed for sleep.    Dispense:  90 capsule    Refill:  1  . sertraline (ZOLOFT) 100 MG tablet    Sig: Take 1 tablet (100 mg total) by mouth daily.    Dispense:  90 tablet    Refill:  3  . chlorthalidone (HYGROTON) 25 MG tablet    Sig: Take 1 tablet (25 mg total) by mouth daily.    Dispense:  90 tablet    Refill:  3  . levothyroxine (SYNTHROID) 75 MCG tablet  Sig: Take 1 tablet (75 mcg total) by mouth daily.    Dispense:  90 tablet    Refill:  3

## 2013-09-18 NOTE — Progress Notes (Signed)
   Subjective:    Patient ID: Danielle RamusDonna H Paglia, female    DOB: 11-13-1950, 63 y.o.   MRN: [id number]  HPI    Review of Systems  HENT: Positive for hearing loss and tinnitus.   Musculoskeletal: Positive for arthralgias and myalgias.  Allergic/Immunologic: Positive for environmental allergies.  Neurological: Positive for headaches.       Objective:   Physical Exam        Assessment & Plan:

## 2013-09-19 LAB — PAP IG AND HPV HIGH-RISK: HPV DNA High Risk: NOT DETECTED

## 2013-09-19 LAB — TSH: TSH: 2.036 u[IU]/mL (ref 0.350–4.500)

## 2013-09-19 MED ORDER — ATORVASTATIN CALCIUM 20 MG PO TABS: 20.0000 mg | ORAL_TABLET | Freq: Every day | ORAL | Status: DC

## 2013-09-19 MED ORDER — SERTRALINE HCL 100 MG PO TABS: 100.0000 mg | ORAL_TABLET | Freq: Every day | ORAL | Status: DC

## 2013-09-19 MED ORDER — LEVOTHYROXINE SODIUM 75 MCG PO TABS: 75.0000 ug | ORAL_TABLET | Freq: Every day | ORAL | Status: DC

## 2013-09-19 MED ORDER — TEMAZEPAM 30 MG PO CAPS: 30.0000 mg | ORAL_CAPSULE | Freq: Every evening | ORAL | Status: DC | PRN

## 2013-09-19 MED ORDER — CHLORTHALIDONE 25 MG PO TABS: 25.0000 mg | ORAL_TABLET | Freq: Every day | ORAL | Status: DC

## 2013-09-20 ENCOUNTER — Encounter: Payer: Self-pay | Admitting: Internal Medicine

## 2013-12-04 ENCOUNTER — Other Ambulatory Visit: Payer: Self-pay | Admitting: Internal Medicine

## 2013-12-05 NOTE — Telephone Encounter (Signed)
Dr Merla Richesoolittle wrote Rx for chlorthalidone 25 QD at 09/2013 CPE. Pharmacist clarified that Dr Dorma RussellKraus had orig been Rxing for pt at 2 tabs QD. Called pt who reported that Dr Merla Richesoolittle was just trying to RF what Dr Dorma RussellKraus had Rxd for pt, but thinks he just sent in 1 tab QD in error. Pt has continued to take 2 tabs QD bc she wasn't aware of change until she ran out early. Can we go ahead and send in corrected Rx for pt w/RFs orig sent? Pended, pt is out of meds.

## 2013-12-06 NOTE — Telephone Encounter (Signed)
Called pt to notify Rx was sent

## 2013-12-23 ENCOUNTER — Encounter: Payer: Self-pay | Admitting: Internal Medicine

## 2013-12-23 DIAGNOSIS — N390 Urinary tract infection, site not specified: Secondary | ICD-10-CM

## 2013-12-23 MED ORDER — NITROFURANTOIN MONOHYD MACRO 100 MG PO CAPS: 100.0000 mg | ORAL_CAPSULE | Freq: Two times a day (BID) | ORAL | Status: DC

## 2013-12-24 ENCOUNTER — Other Ambulatory Visit: Payer: Self-pay

## 2013-12-24 MED ORDER — NITROFURANTOIN MONOHYD MACRO 100 MG PO CAPS: 100.0000 mg | ORAL_CAPSULE | Freq: Two times a day (BID) | ORAL | Status: DC

## 2013-12-24 NOTE — Telephone Encounter (Signed)
Dr Merla Riches called to reports [facility] did not get the Rx he sent yesterday. I resent w/"normal" setting (orig had been set on print). He notified pt was being resent.

## 2013-12-27 NOTE — Telephone Encounter (Signed)
Called rx in  

## 2014-03-25 ENCOUNTER — Other Ambulatory Visit: Payer: Self-pay | Admitting: Internal Medicine

## 2014-03-26 NOTE — Telephone Encounter (Signed)
Faxed

## 2014-06-06 ENCOUNTER — Other Ambulatory Visit: Payer: Self-pay

## 2014-06-06 DIAGNOSIS — Z1231 Encounter for screening mammogram for malignant neoplasm of breast: Secondary | ICD-10-CM

## 2014-06-08 ENCOUNTER — Other Ambulatory Visit: Payer: Self-pay | Admitting: Physician Assistant

## 2014-06-24 ENCOUNTER — Ambulatory Visit
Admission: RE | Admit: 2014-06-24 | Discharge: 2014-06-24 | Disposition: A | Discharge status: Home / Self Care | Payer: BC Managed Care – PPO | Source: Ambulatory Visit

## 2014-06-24 DIAGNOSIS — Z1231 Encounter for screening mammogram for malignant neoplasm of breast: Secondary | ICD-10-CM

## 2014-07-05 ENCOUNTER — Other Ambulatory Visit: Payer: Self-pay | Admitting: Physician Assistant

## 2014-07-06 NOTE — Telephone Encounter (Signed)
Dr Merla Richesoolittle, pt hasn't been in since last Feb, but has CPE sch for 09/24/14. Do you want to RF until then or have her RTC sooner?

## 2014-09-22 ENCOUNTER — Other Ambulatory Visit: Payer: Self-pay | Admitting: Internal Medicine

## 2014-09-24 ENCOUNTER — Encounter: Payer: Self-pay | Admitting: Internal Medicine

## 2014-09-24 ENCOUNTER — Ambulatory Visit (INDEPENDENT_AMBULATORY_CARE_PROVIDER_SITE_OTHER): Payer: BLUE CROSS/BLUE SHIELD | Admitting: Internal Medicine

## 2014-09-24 VITALS — BP 126/81 | HR 66 | Temp 98.3°F | Resp 16 | Ht 64.5 in | Wt 174.0 lb

## 2014-09-24 DIAGNOSIS — E039 Hypothyroidism, unspecified: Secondary | ICD-10-CM

## 2014-09-24 DIAGNOSIS — F329 Major depressive disorder, single episode, unspecified: Secondary | ICD-10-CM

## 2014-09-24 DIAGNOSIS — F32A Depression, unspecified: Secondary | ICD-10-CM

## 2014-09-24 DIAGNOSIS — E785 Hyperlipidemia, unspecified: Secondary | ICD-10-CM

## 2014-09-24 DIAGNOSIS — Z0000 Encounter for general adult medical examination without abnormal findings: Secondary | ICD-10-CM

## 2014-09-24 DIAGNOSIS — Z23 Encounter for immunization: Secondary | ICD-10-CM

## 2014-09-24 LAB — CBC WITH DIFFERENTIAL/PLATELET
Basophils Absolute: 0.1 10*3/uL (ref 0.0–0.1)
Basophils Relative: 2 % — ABNORMAL HIGH (ref 0–1)
Eosinophils Absolute: 0.2 10*3/uL (ref 0.0–0.7)
Eosinophils Relative: 5 % (ref 0–5)
HEMATOCRIT: 41.2 % (ref 36.0–46.0)
HEMOGLOBIN: 13.9 g/dL (ref 12.0–15.0)
LYMPHS ABS: 1 10*3/uL (ref 0.7–4.0)
LYMPHS PCT: 25 % (ref 12–46)
MCH: 30.8 pg (ref 26.0–34.0)
MCHC: 33.7 g/dL (ref 30.0–36.0)
MCV: 91.4 fL (ref 78.0–100.0)
MPV: 9.1 fL (ref 8.6–12.4)
Monocytes Absolute: 0.4 10*3/uL (ref 0.1–1.0)
Monocytes Relative: 9 % (ref 3–12)
NEUTROS PCT: 59 % (ref 43–77)
Neutro Abs: 2.3 10*3/uL (ref 1.7–7.7)
Platelets: 284 10*3/uL (ref 150–400)
RBC: 4.51 MIL/uL (ref 3.87–5.11)
RDW: 13 % (ref 11.5–15.5)
WBC: 3.9 10*3/uL — AB (ref 4.0–10.5)

## 2014-09-24 LAB — COMPLETE METABOLIC PANEL WITH GFR
ALK PHOS: 67 U/L (ref 39–117)
ALT: 25 U/L (ref 0–35)
AST: 25 U/L (ref 0–37)
Albumin: 4.5 g/dL (ref 3.5–5.2)
BUN: 18 mg/dL (ref 6–23)
CO2: 26 mEq/L (ref 19–32)
Calcium: 9.6 mg/dL (ref 8.4–10.5)
Chloride: 104 mEq/L (ref 96–112)
Creat: 0.86 mg/dL (ref 0.50–1.10)
GFR, EST AFRICAN AMERICAN: 83 mL/min
GFR, Est Non African American: 72 mL/min
Glucose, Bld: 80 mg/dL (ref 70–99)
Potassium: 4.4 mEq/L (ref 3.5–5.3)
SODIUM: 139 meq/L (ref 135–145)
TOTAL PROTEIN: 7 g/dL (ref 6.0–8.3)
Total Bilirubin: 1 mg/dL (ref 0.2–1.2)

## 2014-09-24 LAB — TSH: TSH: 2.145 u[IU]/mL (ref 0.350–4.500)

## 2014-09-24 LAB — LIPID PANEL
Cholesterol: 171 mg/dL (ref 0–200)
HDL: 60 mg/dL (ref >=46)
LDL Cholesterol: 99 mg/dL (ref 0–99)
Total CHOL/HDL Ratio: 2.9 Ratio
Triglycerides: 62 mg/dL (ref <150)
VLDL: 12 mg/dL (ref 0–40)

## 2014-09-24 MED ORDER — LEVOTHYROXINE SODIUM 75 MCG PO TABS: 75.0000 ug | ORAL_TABLET | Freq: Every day | ORAL | Status: DC

## 2014-09-24 MED ORDER — ATORVASTATIN CALCIUM 20 MG PO TABS: 20.0000 mg | ORAL_TABLET | Freq: Every day | ORAL | Status: DC

## 2014-09-24 MED ORDER — CHLORTHALIDONE 25 MG PO TABS: 25.0000 mg | ORAL_TABLET | Freq: Every day | ORAL | Status: DC

## 2014-09-24 MED ORDER — TEMAZEPAM 30 MG PO CAPS: 30.0000 mg | ORAL_CAPSULE | Freq: Every day | ORAL | Status: DC

## 2014-09-24 MED ORDER — ZOSTER VACCINE LIVE 19400 UNT/0.65ML ~~LOC~~ SOLR: 0.6500 mL | Freq: Once | SUBCUTANEOUS | Status: DC

## 2014-09-24 MED ORDER — SERTRALINE HCL 100 MG PO TABS: 100.0000 mg | ORAL_TABLET | Freq: Every day | ORAL | Status: DC

## 2014-09-25 NOTE — Progress Notes (Signed)
Subjective:    Patient ID: Danielle Jacobs, female    DOB: November 12, 1950, 64 y.o.   MRN: [id number]  HPIannual Patient Active Problem List   Diagnosis Date Noted  . BMI 29.0-29.9,adult 04/18/2012  . Hyperlipidemia--on ,meds 04/18/2012  . Hypothyroidism---stable 04/18/2012  . Depression---doing well on zoloft///daughter still be strressor 04/18/2012  . Occipital neuralgia//head pressure pain--after 3 yrs of sxtoms aggr by post chges like yoga, she has had resolut over last 3 mos--?why///still no cleardx despite lots of eval/rx 04/18/2012  . Meniere's disease---stable 04/18/2012   Retired //may move to [employer] Didn't use the zvax rx Pap good til 2020-hpv neg All else good  fam hx breast ca mo/sis  Review of Systems 14 pt form neg x PI    Objective:   Physical Exam  Constitutional: She is oriented to person, place, and time. She appears well-developed and well-nourished. No distress.  HENT:  Head: Normocephalic.  Right Ear: External ear normal.  Left Ear: External ear normal.  Nose: Nose normal.  Mouth/Throat: Oropharynx is clear and moist.  Eyes: Conjunctivae and EOM are normal. Pupils are equal, round, and reactive to light.  Neck: Normal range of motion. Neck supple. No thyromegaly present.  Cardiovascular: Normal rate, regular rhythm, normal heart sounds and intact distal pulses.   No murmur heard. Pulmonary/Chest: Effort normal and breath sounds normal. She has no wheezes.  Breasts w/out masses  Abdominal: Soft. Bowel sounds are normal. She exhibits no distension and no mass. There is no tenderness. There is no rebound.  Musculoskeletal: Normal range of motion. She exhibits no edema or tenderness.  Lymphadenopathy:    She has no cervical adenopathy.  Neurological: She is alert and oriented to person, place, and time. She has normal reflexes. No cranial nerve deficit.  Skin: Skin is warm and dry. No rash noted.  Psychiatric: She has a normal mood and affect. Her behavior  is normal. Judgment and thought content normal.  Nursing note and vitals reviewed.  BP 126/81 mmHg  Pulse 66  Temp(Src) 98.3 F (36.8 C) (Oral)  Resp 16  Ht 5' 4.5" (1.638 m)  Wt 174 lb (78.926 kg)  BMI 29.42 kg/m2  SpO2 96%  Results for orders placed or performed in visit on 09/24/14  CBC with Differential/Platelet  Result Value Ref Range   WBC 3.9 (L) 4.0 - 10.5 K/uL   RBC 4.51 3.87 - 5.11 MIL/uL   Hemoglobin 13.9 12.0 - 15.0 g/dL   HCT 41.2 36.0 - 46.0 %   MCV 91.4 78.0 - 100.0 fL   MCH 30.8 26.0 - 34.0 pg   MCHC 33.7 30.0 - 36.0 g/dL   RDW 13.0 11.5 - 15.5 %   Platelets 284 150 - 400 K/uL   MPV 9.1 8.6 - 12.4 fL   Neutrophils Relative % 59 43 - 77 %   Neutro Abs 2.3 1.7 - 7.7 K/uL   Lymphocytes Relative 25 12 - 46 %   Lymphs Abs 1.0 0.7 - 4.0 K/uL   Monocytes Relative 9 3 - 12 %   Monocytes Absolute 0.4 0.1 - 1.0 K/uL   Eosinophils Relative 5 0 - 5 %   Eosinophils Absolute 0.2 0.0 - 0.7 K/uL   Basophils Relative 2 (H) 0 - 1 %   Basophils Absolute 0.1 0.0 - 0.1 K/uL   Smear Review Criteria for review not met   COMPLETE METABOLIC PANEL WITH GFR  Result Value Ref Range   Sodium 139 135 - 145 mEq/L  Potassium 4.4 3.5 - 5.3 mEq/L   Chloride 104 96 - 112 mEq/L   CO2 26 19 - 32 mEq/L   Glucose, Bld 80 70 - 99 mg/dL   BUN 18 6 - 23 mg/dL   Creat 0.86 0.50 - 1.10 mg/dL   Total Bilirubin 1.0 0.2 - 1.2 mg/dL   Alkaline Phosphatase 67 39 - 117 U/L   AST 25 0 - 37 U/L   ALT 25 0 - 35 U/L   Total Protein 7.0 6.0 - 8.3 g/dL   Albumin 4.5 3.5 - 5.2 g/dL   Calcium 9.6 8.4 - 10.5 mg/dL   GFR, Est African American 83 mL/min   GFR, Est Non African American 72 mL/min  TSH  Result Value Ref Range   TSH 2.145 0.350 - 4.500 uIU/mL  Lipid panel  Result Value Ref Range   Cholesterol 171 0 - 200 mg/dL   Triglycerides 62 <150 mg/dL   HDL 60 >=46 mg/dL   Total CHOL/HDL Ratio 2.9 Ratio   VLDL 12 0 - 40 mg/dL   LDL Cholesterol 99 0 - 99 mg/dL        Assessment & Plan:    Depression - Plan: sertraline (ZOLOFT) 100 MG tablet, CBC with Differential/Platelet, COMPLETE METABOLIC PANEL WITH GFR  Hypothyroidism, unspecified hypothyroidism type - Plan: CBC with Differential/Platelet, COMPLETE METABOLIC PANEL WITH GFR, TSH  Hyperlipidemia - Plan: CBC with Differential/Platelet, COMPLETE METABOLIC PANEL WITH GFR, Lipid panel  Annual physical exam  Need for prophylactic vaccination and inoculation against influenza - Plan: Flu Vaccine QUAD 36+ mos IM  Need for prophylactic vaccination against Streptococcus pneumoniae (pneumococcus) - Plan: Pneumococcal polysaccharide vaccine 23-valent greater than or equal to 2yo subcutaneous/IM  Meds ordered this encounter  Medications  . zoster vaccine live, PF, (ZOSTAVAX) 65465 UNT/0.65ML injection    Sig: Inject 19,400 Units into the skin once. Administer at pharmacy    Dispense:  1 each    Refill:  0  . chlorthalidone (HYGROTON) 25 MG tablet---using for menieres but off recently doing well    Sig: Take 1 tablet (25 mg total) by mouth daily.    Dispense:  30 tablet    Refill:  5  . atorvastatin (LIPITOR) 20 MG tablet    Sig: Take 1 tablet (20 mg total) by mouth daily.    Dispense:  90 tablet    Refill:  3  . sertraline (ZOLOFT) 100 MG tablet    Sig: Take 1 tablet (100 mg total) by mouth daily.    Dispense:  90 tablet    Refill:  3  . temazepam (RESTORIL) 30 MG capsule    Sig: Take 1 capsule (30 mg total) by mouth at bedtime.    Dispense:  90 capsule    Refill:  3  . levothyroxine (SYNTHROID) 75 MCG tablet    Sig: Take 1 tablet (75 mcg total) by mouth daily.    Dispense:  90 tablet    Refill:  3

## 2015-04-03 ENCOUNTER — Other Ambulatory Visit: Payer: Self-pay | Admitting: Family Medicine

## 2015-04-03 NOTE — Telephone Encounter (Signed)
Last OV 09/24/2014, Last filled 09/24/2014

## 2015-04-05 MED ORDER — TEMAZEPAM 30 MG PO CAPS: 30.0000 mg | ORAL_CAPSULE | Freq: Every day | ORAL | Status: AC

## 2015-04-06 NOTE — Telephone Encounter (Signed)
Rx faxed

## 2015-05-25 ENCOUNTER — Other Ambulatory Visit: Payer: Self-pay

## 2015-05-25 DIAGNOSIS — Z1231 Encounter for screening mammogram for malignant neoplasm of breast: Secondary | ICD-10-CM

## 2015-06-29 ENCOUNTER — Ambulatory Visit
Admission: RE | Admit: 2015-06-29 | Discharge: 2015-06-29 | Disposition: A | Discharge status: Home / Self Care | Payer: BLUE CROSS/BLUE SHIELD | Source: Ambulatory Visit

## 2015-06-29 DIAGNOSIS — Z1231 Encounter for screening mammogram for malignant neoplasm of breast: Secondary | ICD-10-CM

## 2015-09-30 ENCOUNTER — Encounter: Payer: Self-pay | Admitting: Internal Medicine

## 2015-09-30 ENCOUNTER — Ambulatory Visit (INDEPENDENT_AMBULATORY_CARE_PROVIDER_SITE_OTHER): Payer: BLUE CROSS/BLUE SHIELD | Admitting: Internal Medicine

## 2015-09-30 VITALS — BP 128/77 | HR 76 | Temp 98.1°F | Resp 16 | Ht 64.0 in | Wt 166.0 lb

## 2015-09-30 DIAGNOSIS — E039 Hypothyroidism, unspecified: Secondary | ICD-10-CM | POA: Diagnosis not present

## 2015-09-30 DIAGNOSIS — E785 Hyperlipidemia, unspecified: Secondary | ICD-10-CM

## 2015-09-30 DIAGNOSIS — Z1159 Encounter for screening for other viral diseases: Secondary | ICD-10-CM | POA: Diagnosis not present

## 2015-09-30 DIAGNOSIS — Z0000 Encounter for general adult medical examination without abnormal findings: Secondary | ICD-10-CM | POA: Diagnosis not present

## 2015-09-30 DIAGNOSIS — F329 Major depressive disorder, single episode, unspecified: Secondary | ICD-10-CM

## 2015-09-30 DIAGNOSIS — Z6829 Body mass index (BMI) 29.0-29.9, adult: Secondary | ICD-10-CM

## 2015-09-30 DIAGNOSIS — F32A Depression, unspecified: Secondary | ICD-10-CM

## 2015-09-30 LAB — CBC WITH DIFFERENTIAL/PLATELET
Basophils Absolute: 0 10*3/uL (ref 0.0–0.1)
Basophils Relative: 1 % (ref 0–1)
Eosinophils Absolute: 0.1 10*3/uL (ref 0.0–0.7)
Eosinophils Relative: 2 % (ref 0–5)
HEMATOCRIT: 44.3 % (ref 36.0–46.0)
HEMOGLOBIN: 14.9 g/dL (ref 12.0–15.0)
LYMPHS PCT: 20 % (ref 12–46)
Lymphs Abs: 1 10*3/uL (ref 0.7–4.0)
MCH: 30.6 pg (ref 26.0–34.0)
MCHC: 33.6 g/dL (ref 30.0–36.0)
MCV: 91 fL (ref 78.0–100.0)
MONO ABS: 0.3 10*3/uL (ref 0.1–1.0)
MPV: 9.1 fL (ref 8.6–12.4)
Monocytes Relative: 7 % (ref 3–12)
NEUTROS ABS: 3.4 10*3/uL (ref 1.7–7.7)
NEUTROS PCT: 70 % (ref 43–77)
Platelets: 266 10*3/uL (ref 150–400)
RBC: 4.87 MIL/uL (ref 3.87–5.11)
RDW: 13.3 % (ref 11.5–15.5)
WBC: 4.8 10*3/uL (ref 4.0–10.5)

## 2015-09-30 LAB — POCT URINALYSIS DIP (MANUAL ENTRY)
BILIRUBIN UA: NEGATIVE
BILIRUBIN UA: NEGATIVE
Glucose, UA: NEGATIVE
Nitrite, UA: NEGATIVE
PH UA: 7
Protein Ur, POC: NEGATIVE
RBC UA: NEGATIVE
SPEC GRAV UA: 1.02
Urobilinogen, UA: 0.2

## 2015-09-30 MED ORDER — SERTRALINE HCL 100 MG PO TABS: 100.0000 mg | ORAL_TABLET | Freq: Every day | ORAL | Status: DC

## 2015-09-30 MED ORDER — TEMAZEPAM 7.5 MG PO CAPS: 7.5000 mg | ORAL_CAPSULE | Freq: Every evening | ORAL | Status: AC | PRN

## 2015-09-30 MED ORDER — TEMAZEPAM 22.5 MG PO CAPS: 22.5000 mg | ORAL_CAPSULE | Freq: Every evening | ORAL | Status: AC | PRN

## 2015-09-30 MED ORDER — TEMAZEPAM 15 MG PO CAPS: 15.0000 mg | ORAL_CAPSULE | Freq: Every evening | ORAL | Status: AC | PRN

## 2015-09-30 NOTE — Progress Notes (Signed)
Subjective:    Patient ID: Danielle Jacobs, female    DOB: 05-Jul-1951, 65 y.o.   MRN: [id number]  HPIannual Patient Active Problem List   Diagnosis Date Noted  . BMI 29.0-29.9,adult 04/18/2012  . Hyperlipidemia 04/18/2012  . Hypothyroidism 04/18/2012  . Depression 04/18/2012  . Occipital neuralgia 04/18/2012  . Meniere's disease 04/18/2012   [place] Her headache and psychological symptoms are greatly improved. 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 She was not able to simply discontinue this.  Her last colonoscopy was at [facility] and she is unsure when she is due for follow-up. She had a benign polyp and thought it would be 10 years but they have sent a recall notice. Other health maintenance issues are up-to-date  Review of Systems 14 point review of systems performed and is entirely negative    Objective:   Physical Exam  Constitutional: She is oriented to person, place, and time. She appears well-developed and well-nourished. No distress.  HENT:  Head: Normocephalic.  Right Ear: External ear normal.  Left Ear: External ear normal.  Nose: Nose normal.  Mouth/Throat: Oropharynx is clear and moist.  Eyes: Conjunctivae and EOM are normal. Pupils are equal, round, and reactive to light.  Neck: Normal range of motion. Neck supple. No thyromegaly present.  Cardiovascular: Normal rate, regular rhythm, normal heart sounds and intact distal pulses.   No murmur heard. Pulmonary/Chest: Effort normal and breath sounds normal. She has no wheezes.  Breast exam normal bilaterally  Abdominal: Soft. Bowel sounds are normal. She exhibits no distension and no mass. There is no tenderness. There is no rebound.  Musculoskeletal: Normal range of motion. She exhibits no edema or tenderness.  Lymphadenopathy:    She has no cervical adenopathy.  Neurological: She is alert and  oriented to person, place, and time. She has normal reflexes. No cranial nerve deficit.  Skin: Skin is warm and dry. No rash noted.  Psychiatric: She has a normal mood and affect. Her behavior is normal. Judgment and thought content normal.  Nursing note and vitals reviewed.  BP 128/77 mmHg  Pulse 76  Temp(Src) 98.1 F (36.7 C)  Resp 16  Ht 5\' 4"  (1.626 m)  Wt 166 lb (75.297 kg)  BMI 28.48 kg/m2 Wt Readings from Last 3 Encounters:  09/30/15 166 lb (75.297 kg)  09/24/14 174 lb (78.926 kg)  09/18/13 171 lb (77.565 kg)      Assessment & Plan:  Depression - Plan: sertraline (ZOLOFT) 100 MG tablet  Hyperlipidemia - Plan: CBC with Differential/Platelet, Comprehensive metabolic panel, Lipid panel  Hypothyroidism, unspecified hypothyroidism type - Plan: TSH, T4, free  BMI 29.0-29.9,adult  Annual physical exam - Plan: POCT urinalysis dipstick  Need for hepatitis C screening test - Plan: Hepatitis C antibody  Results for orders placed or performed in visit on 09/30/15  CBC with Differential/Platelet  Result Value Ref Range   WBC 4.8 4.0 - 10.5 K/uL   RBC 4.87 3.87 - 5.11 MIL/uL   Hemoglobin 14.9 12.0 - 15.0 g/dL   HCT 11/30/15 24.3 - 27.5 %   MCV 91.0 78.0 - 100.0 fL   MCH 30.6 26.0 - 34.0 pg   MCHC 33.6 30.0 - 36.0 g/dL   RDW 56.2 39.2 - 15.1 %   Platelets 266 150 - 400 K/uL   MPV 9.1 8.6 - 12.4 fL   Neutrophils Relative %  70 43 - 77 %   Neutro Abs 3.4 1.7 - 7.7 K/uL   Lymphocytes Relative 20 12 - 46 %   Lymphs Abs 1.0 0.7 - 4.0 K/uL   Monocytes Relative 7 3 - 12 %   Monocytes Absolute 0.3 0.1 - 1.0 K/uL   Eosinophils Relative 2 0 - 5 %   Eosinophils Absolute 0.1 0.0 - 0.7 K/uL   Basophils Relative 1 0 - 1 %   Basophils Absolute 0.0 0.0 - 0.1 K/uL   Smear Review Criteria for review not met   Comprehensive metabolic panel  Result Value Ref Range   Sodium 138 135 - 146 mmol/L   Potassium 4.3 3.5 - 5.3 mmol/L   Chloride 104 98 - 110 mmol/L   CO2 24 20 - 31 mmol/L    Glucose, Bld 94 65 - 99 mg/dL   BUN 18 7 - 25 mg/dL   Creat 0.95 0.50 - 0.99 mg/dL   Total Bilirubin 1.2 0.2 - 1.2 mg/dL   Alkaline Phosphatase 95 33 - 130 U/L   AST 24 10 - 35 U/L   ALT 23 6 - 29 U/L   Total Protein 7.4 6.1 - 8.1 g/dL   Albumin 4.8 3.6 - 5.1 g/dL   Calcium 10.1 8.6 - 10.4 mg/dL  Lipid panel  Result Value Ref Range   Cholesterol 193 125 - 200 mg/dL   Triglycerides 93 <150 mg/dL   HDL 61 >=46 mg/dL   Total CHOL/HDL Ratio 3.2 <=5.0 Ratio   VLDL 19 <30 mg/dL   LDL Cholesterol 113 <130 mg/dL  TSH  Result Value Ref Range   TSH 1.88 mIU/L  T4, free  Result Value Ref Range   Free T4 1.0 0.8 - 1.8 ng/dL  Hepatitis C antibody  Result Value Ref Range   HCV Ab NEGATIVE NEGATIVE  POCT urinalysis dipstick  Result Value Ref Range   Color, UA yellow yellow   Clarity, UA clear clear   Glucose, UA negative negative   Bilirubin, UA negative negative   Ketones, POC UA negative negative   Spec Grav, UA 1.020    Blood, UA negative negative   pH, UA 7.0    Protein Ur, POC negative negative   Urobilinogen, UA 0.2    Nitrite, UA Negative Negative   Leukocytes, UA Trace (A) Negative   No change in Synthroid dose Increase Lipitor to 40 mg daily Begin to wean Restoril Meds ordered this encounter  Medications  . temazepam (RESTORIL) 22.5 MG capsule    Sig: Take 1 capsule (22.5 mg total) by mouth at bedtime as needed for sleep.    Dispense:  30 capsule    Refill:  0  . temazepam (RESTORIL) 15 MG capsule    Sig: Take 1 capsule (15 mg total) by mouth at bedtime as needed for sleep.    Dispense:  30 capsule    Refill:  0  . temazepam (RESTORIL) 7.5 MG capsule    Sig: Take 1 capsule (7.5 mg total) by mouth at bedtime as needed for sleep.    Dispense:  30 capsule    Refill:  0  . sertraline (ZOLOFT) 100 MG tablet    Sig: Take 1 tablet (100 mg total) by mouth daily.    Dispense:  90 tablet    Refill:  3  . levothyroxine (SYNTHROID) 75 MCG tablet    Sig: Take 1 tablet (75  mcg total) by mouth daily.    Dispense:  90 tablet  Refill:  3  . atorvastatin (LIPITOR) 40 MG tablet    Sig: Take 1 tablet (40 mg total) by mouth daily.    Dispense:  90 tablet    Refill:  3

## 2015-10-01 LAB — COMPREHENSIVE METABOLIC PANEL
ALBUMIN: 4.8 g/dL (ref 3.6–5.1)
ALT: 23 U/L (ref 6–29)
AST: 24 U/L (ref 10–35)
Alkaline Phosphatase: 95 U/L (ref 33–130)
BUN: 18 mg/dL (ref 7–25)
CALCIUM: 10.1 mg/dL (ref 8.6–10.4)
CO2: 24 mmol/L (ref 20–31)
CREATININE: 0.95 mg/dL (ref 0.50–0.99)
Chloride: 104 mmol/L (ref 98–110)
Glucose, Bld: 94 mg/dL (ref 65–99)
Potassium: 4.3 mmol/L (ref 3.5–5.3)
Sodium: 138 mmol/L (ref 135–146)
TOTAL PROTEIN: 7.4 g/dL (ref 6.1–8.1)
Total Bilirubin: 1.2 mg/dL (ref 0.2–1.2)

## 2015-10-01 LAB — LIPID PANEL
CHOL/HDL RATIO: 3.2 ratio (ref <=5.0)
Cholesterol: 193 mg/dL (ref 125–200)
HDL: 61 mg/dL (ref >=46)
LDL CALC: 113 mg/dL (ref <130)
Triglycerides: 93 mg/dL (ref <150)
VLDL: 19 mg/dL (ref <30)

## 2015-10-01 LAB — HEPATITIS C ANTIBODY: HCV Ab: NEGATIVE

## 2015-10-01 LAB — TSH: TSH: 1.88 m[IU]/L

## 2015-10-01 LAB — T4, FREE: FREE T4: 1 ng/dL (ref 0.8–1.8)

## 2015-10-02 MED ORDER — ATORVASTATIN CALCIUM 40 MG PO TABS: 40.0000 mg | ORAL_TABLET | Freq: Every day | ORAL | Status: DC

## 2015-10-02 MED ORDER — LEVOTHYROXINE SODIUM 75 MCG PO TABS: 75.0000 ug | ORAL_TABLET | Freq: Every day | ORAL | Status: DC

## 2016-01-14 ENCOUNTER — Other Ambulatory Visit: Payer: Self-pay

## 2016-01-14 DIAGNOSIS — F329 Major depressive disorder, single episode, unspecified: Secondary | ICD-10-CM

## 2016-01-14 DIAGNOSIS — F32A Depression, unspecified: Secondary | ICD-10-CM

## 2016-01-14 MED ORDER — LEVOTHYROXINE SODIUM 75 MCG PO TABS: 75.0000 ug | ORAL_TABLET | Freq: Every day | ORAL | Status: AC

## 2016-01-14 MED ORDER — SERTRALINE HCL 100 MG PO TABS: 100.0000 mg | ORAL_TABLET | Freq: Every day | ORAL | Status: AC

## 2016-01-14 MED ORDER — ATORVASTATIN CALCIUM 40 MG PO TABS: 40.0000 mg | ORAL_TABLET | Freq: Every day | ORAL | Status: AC
